# Patient Record
Sex: Female | Born: 1955 | Race: White | Hispanic: No | Marital: Single | Smoking: Never smoker
Health system: Southern US, Community
[De-identification: ages and names within clinical notes are randomized; demographics above are authoritative.]

## PROBLEM LIST (undated history)

## (undated) DIAGNOSIS — E119 Type 2 diabetes mellitus without complications: Secondary | ICD-10-CM

## (undated) DIAGNOSIS — I10 Essential (primary) hypertension: Secondary | ICD-10-CM

## (undated) DIAGNOSIS — M792 Neuralgia and neuritis, unspecified: Secondary | ICD-10-CM

## (undated) HISTORY — PX: ROTATOR CUFF REPAIR: SHX139

## (undated) HISTORY — PX: ABDOMINAL HYSTERECTOMY: SHX81

---

## 2007-11-29 ENCOUNTER — Ambulatory Visit: Payer: Self-pay | Admitting: Family Medicine

## 2012-03-24 ENCOUNTER — Ambulatory Visit: Payer: Self-pay | Admitting: Internal Medicine

## 2012-03-24 LAB — COMPREHENSIVE METABOLIC PANEL
Albumin: 3.9 g/dL (ref 3.4–5.0)
Alkaline Phosphatase: 101 U/L (ref 50–136)
Anion Gap: 10 (ref 7–16)
Bilirubin,Total: 0.3 mg/dL (ref 0.2–1.0)
Calcium, Total: 9.2 mg/dL (ref 8.5–10.1)
Chloride: 100 mmol/L (ref 98–107)
Co2: 25 mmol/L (ref 21–32)
Creatinine: 0.98 mg/dL (ref 0.60–1.30)
EGFR (African American): >60
EGFR (Non-African Amer.): >60
Glucose: 122 mg/dL — ABNORMAL HIGH (ref 65–99)
Osmolality: 277 (ref 275–301)
SGOT(AST): 70 U/L — ABNORMAL HIGH (ref 15–37)
SGPT (ALT): 70 U/L
Total Protein: 8.2 g/dL (ref 6.4–8.2)

## 2012-03-24 LAB — URINALYSIS, COMPLETE
Bacteria: NEGATIVE
Bilirubin,UR: NEGATIVE
Glucose,UR: NEGATIVE mg/dL (ref 0–75)
Ketone: NEGATIVE
Nitrite: NEGATIVE
Ph: 6 (ref 4.5–8.0)

## 2012-03-24 LAB — CBC WITH DIFFERENTIAL/PLATELET
Basophil #: 0 10*3/uL (ref 0.0–0.1)
Basophil %: 0.3 %
Eosinophil #: 0 10*3/uL (ref 0.0–0.7)
Eosinophil %: 0.4 %
HCT: 39.5 % (ref 35.0–47.0)
MCH: 29.2 pg (ref 26.0–34.0)
MCHC: 33 g/dL (ref 32.0–36.0)
MCV: 89 fL (ref 80–100)
Monocyte #: 0.9 x10 3/mm (ref 0.2–0.9)
Monocyte %: 12 %
Neutrophil #: 4.3 10*3/uL (ref 1.4–6.5)
Platelet: 194 10*3/uL (ref 150–440)
RDW: 13.7 % (ref 11.5–14.5)
WBC: 7.8 10*3/uL (ref 3.6–11.0)

## 2012-03-24 LAB — AMYLASE: Amylase: 19 U/L — ABNORMAL LOW (ref 25–115)

## 2012-03-24 LAB — LIPASE, BLOOD: Lipase: 289 U/L (ref 73–393)

## 2012-03-25 LAB — URINE CULTURE

## 2017-09-22 ENCOUNTER — Other Ambulatory Visit: Payer: Self-pay | Admitting: Orthopedic Surgery

## 2017-09-22 DIAGNOSIS — M25562 Pain in left knee: Secondary | ICD-10-CM

## 2017-09-29 ENCOUNTER — Ambulatory Visit
Admission: RE | Admit: 2017-09-29 | Discharge: 2017-09-29 | Disposition: A | Discharge status: Home / Self Care | Payer: BLUE CROSS/BLUE SHIELD | Source: Ambulatory Visit | Attending: Orthopedic Surgery | Admitting: Orthopedic Surgery

## 2017-09-29 DIAGNOSIS — M25562 Pain in left knee: Secondary | ICD-10-CM | POA: Diagnosis present

## 2017-09-29 DIAGNOSIS — M1712 Unilateral primary osteoarthritis, left knee: Secondary | ICD-10-CM | POA: Diagnosis not present

## 2018-10-10 ENCOUNTER — Ambulatory Visit
Admission: EM | Admit: 2018-10-10 | Discharge: 2018-10-10 | Disposition: A | Discharge status: Home / Self Care | Payer: BLUE CROSS/BLUE SHIELD | Attending: Family Medicine | Admitting: Family Medicine

## 2018-10-10 ENCOUNTER — Other Ambulatory Visit: Payer: Self-pay

## 2018-10-10 ENCOUNTER — Encounter: Payer: Self-pay | Admitting: Emergency Medicine

## 2018-10-10 DIAGNOSIS — M79601 Pain in right arm: Secondary | ICD-10-CM

## 2018-10-10 HISTORY — DX: Neuralgia and neuritis, unspecified: M79.2

## 2018-10-10 HISTORY — DX: Essential (primary) hypertension: I10

## 2018-10-10 HISTORY — DX: Type 2 diabetes mellitus without complications: E11.9

## 2018-10-10 MED ORDER — MELOXICAM 15 MG PO TABS: 15.0000 mg | ORAL_TABLET | Freq: Every day | ORAL | 0 refills | Status: DC | PRN

## 2018-10-10 NOTE — Discharge Instructions (Signed)
Use the mobic as prescribed.  No ibuprofen and naproxen while on the mobic.  Take care  Dr. Adriana Simas

## 2018-10-10 NOTE — ED Triage Notes (Signed)
Patient c/o right arm pain and hand pain that started today. She does state that she got her right middle finger got caught inside the dryer door and is unsure if this is related to her pain now.

## 2018-10-10 NOTE — ED Provider Notes (Signed)
[facility]    CSN: [id number] Arrival date & time: 10/10/18  1251  History   Chief Complaint Chief Complaint  Patient presents with  . Hand Pain   HPI  62 year old Robin [name] presents with right arm and hand pain.  Started yesterday.  Patient states that on Sunday she hyperextended her middle finger.  She is unsure if this is contributing.  Patient reports achy pain in the middle of her dorsal forearm which extends in a linear fashion to her middle finger.  Pain has been severe but is improved currently.  She has taken some ibuprofen with improvement.  No wrist pain.  No elbow pain.  No numbness or tingling.  No other associated symptoms.  No other complaints.  Hx reviewed and updated as below. Past Medical History:  Diagnosis Date  . Diabetes mellitus without complication (HCC)   . Hypertension   . Nerve pain    Past Surgical History:  Procedure Laterality Date  . ABDOMINAL HYSTERECTOMY    . ROTATOR CUFF REPAIR     OB History   None    Home Medications    Prior to Admission medications   Medication Sig Start Date End Date Taking? Authorizing Provider  gabapentin (NEURONTIN) 300 MG capsule  09/12/18   [provider]  losartan (COZAAR) 100 MG tablet  09/12/18   [provider]  Magnesium Oxide, Antacid, 500 MG CAPS Take by mouth.    [provider]  meloxicam (MOBIC) 15 MG tablet Take 1 tablet (15 mg total) by mouth daily as needed. 10/10/18   Tommie Sams, DO  metFORMIN (GLUCOPHAGE) 500 MG tablet  08/03/18   [provider]  Multiple Vitamin (MULTI-VITAMINS) TABS Take by mouth.    [provider]  VICTOZA 18 MG/3ML SOPN  09/15/18   [provider]   Social History Social History   Tobacco Use  . Smoking status: Never Smoker  . Smokeless tobacco: Current User  Substance Use Topics  . Alcohol use: Never    Frequency: Never  . Drug use: Never     Allergies   Patient has no known  allergies.   Review of Systems Review of Systems  Musculoskeletal:       Arm/hand pain (right).  Neurological: Negative for numbness.   Physical Exam Triage Vital Signs ED Triage Vitals  Enc Vitals Group     BP 10/10/18 1304 118/Robin     Pulse Rate 10/10/18 1304 76     Resp 10/10/18 1304 18     Temp 10/10/18 1304 98 F (36.7 C)     Temp Source 10/10/18 1304 Oral     SpO2 10/10/18 1304 99 %     Weight 10/10/18 1300 190 lb (86.2 kg)     Height 10/10/18 1300 5\' 3"  (1.6 m)     Head Circumference --      Peak Flow --      Pain Score 10/10/18 1300 4     Pain Loc --      Pain Edu? --      Excl. in GC? --    Updated Vital Signs BP 118/Robin (BP Location: Left Arm)   Pulse 76   Temp 98 F (36.7 C) (Oral)   Resp 18   Ht 5\' 3"  (1.6 m)   Wt 86.2 kg   SpO2 99%   BMI 33.66 kg/m   Visual Acuity Right Eye Distance:   Left Eye Distance:   Bilateral Distance:    Right  Eye Near:   Left Eye Near:    Bilateral Near:     Physical Exam  Constitutional: She is oriented to person, place, and time. She appears well-developed. No distress.  HENT:  Head: Normocephalic.  Right Ear: External ear normal.  Pulmonary/Chest: Effort normal. No respiratory distress.  Musculoskeletal:  Elbow, forearm, and hand without discrete areas of tenderness.  Normal range of motion.  Neurological: She is alert and oriented to person, place, and time.  Psychiatric: She has a normal mood and affect. Her behavior is normal.  Nursing note and vitals reviewed.  UC Treatments / Results  Labs (all labs ordered are listed, but only abnormal results are displayed) Labs Reviewed - No data to display  EKG None  Radiology No results found.  Procedures Procedures (including critical care time)  Medications Ordered in UC Medications - No data to display  Initial Impression / Assessment and Plan / UC Course  I have reviewed the triage vital signs and the nursing notes.  Pertinent labs & imaging results  that were available during my care of the patient were reviewed by me and considered in my medical decision making (see chart for details).    62 year old Robin [name] presents with right forearm pain.  Etiology unclear.  Could be related to hyperextension of the middle finger.  No indication for imaging.  Already improved.  Meloxicam as needed.  Supportive care.  Final Clinical Impressions(s) / UC Diagnoses   Final diagnoses:  Right arm pain     Discharge Instructions     Use the mobic as prescribed.  No ibuprofen and naproxen while on the mobic.  Take care  Dr. Adriana Simas    ED Prescriptions    Medication Sig Dispense Auth. Provider   meloxicam (MOBIC) 15 MG tablet Take 1 tablet (15 mg total) by mouth daily as needed. 30 tablet Tommie Sams, DO     Controlled Substance Prescriptions  Controlled Substance Registry consulted? Not Applicable   Tommie Sams, DO 10/10/18 1344

## 2018-11-06 ENCOUNTER — Other Ambulatory Visit: Payer: Self-pay | Admitting: Family Medicine

## 2020-07-21 ENCOUNTER — Ambulatory Visit (INDEPENDENT_AMBULATORY_CARE_PROVIDER_SITE_OTHER): Payer: Medicare (Managed Care)

## 2020-07-21 ENCOUNTER — Ambulatory Visit
Admission: EM | Admit: 2020-07-21 | Discharge: 2020-07-21 | Disposition: A | Discharge status: Home / Self Care | Payer: Medicare (Managed Care)

## 2020-07-21 ENCOUNTER — Other Ambulatory Visit: Payer: Self-pay

## 2020-07-21 ENCOUNTER — Encounter: Payer: Self-pay | Admitting: Emergency Medicine

## 2020-07-21 DIAGNOSIS — S0083XA Contusion of other part of head, initial encounter: Secondary | ICD-10-CM

## 2020-07-21 DIAGNOSIS — W19XXXA Unspecified fall, initial encounter: Secondary | ICD-10-CM

## 2020-07-21 DIAGNOSIS — S2241XA Multiple fractures of ribs, right side, initial encounter for closed fracture: Secondary | ICD-10-CM

## 2020-07-21 MED ORDER — NAPROXEN 500 MG PO TABS: 500.0000 mg | ORAL_TABLET | Freq: Two times a day (BID) | ORAL | 0 refills | Status: AC

## 2020-07-21 NOTE — ED Triage Notes (Addendum)
Pt fell about 3 days ago. She hit her right cheek on concert. She has swelling and bruising in the area. She also c/o right sided rib pain. She later states she was nauseous and dizzy after the fall. She has had a headache since the fall.

## 2020-07-21 NOTE — ED Provider Notes (Signed)
[facility]    CSN: [id number] Arrival date & time: 07/21/20  1109      History   Chief Complaint Chief Complaint  Patient presents with  . Fall    HPI Robin [name] is a 64 y.o. female.   Patient is a 64 year old female who presents with chief complaint of pain after a fall on Friday.  Patient states she was walking out to the mailbox when she fell on a sidewalk, falling and hitting her chest and then her face on the concrete.  She states it felt like hitting a hammer.  She states she was dazed a bit afterwards but got herself up to a sitting position and was able to get up and get into her car that was parked nearby.  She sat in her car and states she started crying which is outside her normal and does not know why.  She also started having some shaking and waves of nausea.  She states after nausea improved she called her daughter help her get back in the house.  That night she had pain on the left side of her chest behind her breast.  She states this is worse with movement and picking up items, coughing, sneezing and taking a deep breath while lying flat.  She also reports the pain is worse after she takes her bra off.  Patient will also reports pain and swelling to the right side of her face.  Feels like she has a little pressure in that area in the cheekbone is tender to the touch.  Patient denies any change to her vision, but again feels like there is a little pressure like when she is rubbing her eyes.  She is been taking ibuprofen and Tylenol for pain.  Patient is not on any blood thinners.  Patient denies any shortness of breath or abdominal symptoms.  She reports she did land on her knees as well but her knees are feeling better.     Past Medical History:  Diagnosis Date  . Diabetes mellitus without complication (HCC)   . Hypertension   . Nerve pain     There are no problems to display for this patient.   Past Surgical History:  Procedure Laterality  Date  . ABDOMINAL HYSTERECTOMY    . ROTATOR CUFF REPAIR      OB History   No obstetric history on file.      Home Medications    Prior to Admission medications   Medication Sig Start Date End Date Taking? Authorizing Provider  gabapentin (NEURONTIN) 300 MG capsule  09/12/18  Yes [provider]  losartan (COZAAR) 100 MG tablet  09/12/18  Yes [provider]  Magnesium Oxide, Antacid, 500 MG CAPS Take by mouth.   Yes [provider]  metFORMIN (GLUCOPHAGE) 500 MG tablet  08/03/18  Yes [provider]  Multiple Vitamin (MULTI-VITAMINS) TABS Take by mouth.   Yes [provider]  naproxen (NAPROSYN) 500 MG tablet Take 1 tablet (500 mg total) by mouth 2 (two) times daily. 07/21/20   Candis Schatz, PA-C  OZEMPIC, 1 MG/DOSE, 2 MG/1.5ML SOPN Inject 1 mg into the skin once a week. 06/02/20   [provider]  VICTOZA 18 MG/3ML SOPN  09/15/18   [provider]    Family History History reviewed. No pertinent family history.  Social History Social History   Tobacco Use  . Smoking status: Never Smoker  . Smokeless tobacco: Current User  Vaping Use  .  Vaping Use: Never used  Substance Use Topics  . Alcohol use: Never  . Drug use: Never     Allergies   Augmentin [amoxicillin-pot clavulanate]   Review of Systems Review of Systems as noted above in HPI.  Other systems reviewed and found to be negative   Physical Exam Triage Vital Signs ED Triage Vitals  Enc Vitals Group     BP 07/21/20 1147 (!) 165/85     Pulse Rate 07/21/20 1147 69     Resp 07/21/20 1147 18     Temp 07/21/20 1147 98.5 F (36.9 C)     Temp Source 07/21/20 1147 Oral     SpO2 07/21/20 1147 100 %     Weight 07/21/20 1142 190 lb 0.6 oz (86.2 kg)     Height 07/21/20 1142 5\' 3"  (1.6 m)     Head Circumference --      Peak Flow --      Pain Score 07/21/20 1142 2     Pain Loc --      Pain Edu? --      Excl. in GC? --    No data found.  Updated  Vital Signs BP (!) 165/85 (BP Location: Left Arm)   Pulse 69   Temp 98.5 F (36.9 C) (Oral)   Resp 18   Ht 5\' 3"  (1.6 m)   Wt 190 lb 0.6 oz (86.2 kg)   SpO2 100%   BMI 33.66 kg/m    Physical Exam Constitutional:      Appearance: Normal appearance. She is not ill-appearing.  HENT:     Head:      Right Ear: Tympanic membrane and ear canal normal.     Left Ear: Tympanic membrane normal.     Nose: Nose normal.  Eyes:     Extraocular Movements: Extraocular movements intact.     Comments: Bruising below right eye, see image pupils equal round reactive to light and accommodation  Cardiovascular:     Rate and Rhythm: Normal rate and regular rhythm.  Pulmonary:     Effort: Pulmonary effort is normal. No respiratory distress.     Breath sounds: Normal breath sounds. No wheezing or rales.  Chest:     Chest wall: Tenderness (on right side above breast to palpation) present.  Musculoskeletal:        General: Normal range of motion.  Skin:    General: Skin is warm and dry.  Neurological:     General: No focal deficit present.     Mental Status: She is alert and oriented to person, place, and time.     Cranial Nerves: No cranial nerve deficit.        UC Treatments / Results  Labs (all labs ordered are listed, but only abnormal results are displayed) Labs Reviewed - No data to display  EKG   Radiology DG Ribs Unilateral W/Chest Right  Result Date: 07/21/2020 CLINICAL DATA:  Right anterior rib pain since fall 4 days ago. EXAM: RIGHT RIBS AND CHEST - 3+ VIEW COMPARISON:  None. FINDINGS: Acute nondisplaced fracture of the right anterior fourth rib. There is no evidence of pneumothorax or pleural effusion. Both lungs are clear. Heart size and mediastinal contours are within normal limits. IMPRESSION: 1. Acute nondisplaced fracture of the right anterior fourth rib. 2.  No active cardiopulmonary disease. Electronically Signed   By: Obie DredgeWilliam T Derry M.D.   On: 07/21/2020 13:48   CT  Head Wo Contrast  Result Date: 07/21/2020 CLINICAL DATA:  Fall 3 days ago with facial trauma. Headache, nausea, and dizziness. EXAM: CT HEAD WITHOUT CONTRAST CT MAXILLOFACIAL WITHOUT CONTRAST TECHNIQUE: Multidetector CT imaging of the head and maxillofacial structures were performed using the standard protocol without intravenous contrast. Multiplanar CT image reconstructions of the maxillofacial structures were also generated. COMPARISON:  None. FINDINGS: CT HEAD FINDINGS Brain: There is no evidence of an acute infarct, intracranial hemorrhage, mass, midline shift, or extra-axial fluid collection. The ventricles and sulci are normal. Vascular: Calcified atherosclerosis at the skull base. No hyperdense vessel. Skull: No fracture or suspicious osseous lesion. Other: None. CT MAXILLOFACIAL FINDINGS Osseous: No acute fracture, destructive osseous process, or mandibular dislocation. Orbits: Unremarkable. Sinuses: Paranasal sinuses and mastoid air cells are clear. Soft tissues: Mild right-sided facial soft tissue swelling. Small left parotid calcifications. IMPRESSION: 1. No evidence of acute intracranial abnormality. 2. No acute maxillofacial fracture. 3. Mild right-sided facial soft tissue swelling. Electronically Signed   By: Sebastian Ache M.D.   On: 07/21/2020 14:47   CT MAXILLOFACIAL WO CONTRAST  Result Date: 07/21/2020 CLINICAL DATA:  Fall 3 days ago with facial trauma. Headache, nausea, and dizziness. EXAM: CT HEAD WITHOUT CONTRAST CT MAXILLOFACIAL WITHOUT CONTRAST TECHNIQUE: Multidetector CT imaging of the head and maxillofacial structures were performed using the standard protocol without intravenous contrast. Multiplanar CT image reconstructions of the maxillofacial structures were also generated. COMPARISON:  None. FINDINGS: CT HEAD FINDINGS Brain: There is no evidence of an acute infarct, intracranial hemorrhage, mass, midline shift, or extra-axial fluid collection. The ventricles and sulci are normal.  Vascular: Calcified atherosclerosis at the skull base. No hyperdense vessel. Skull: No fracture or suspicious osseous lesion. Other: None. CT MAXILLOFACIAL FINDINGS Osseous: No acute fracture, destructive osseous process, or mandibular dislocation. Orbits: Unremarkable. Sinuses: Paranasal sinuses and mastoid air cells are clear. Soft tissues: Mild right-sided facial soft tissue swelling. Small left parotid calcifications. IMPRESSION: 1. No evidence of acute intracranial abnormality. 2. No acute maxillofacial fracture. 3. Mild right-sided facial soft tissue swelling. Electronically Signed   By: Sebastian Ache M.D.   On: 07/21/2020 14:47    Procedures Procedures (including critical care time)  Medications Ordered in UC Medications - No data to display  Initial Impression / Assessment and Plan / UC Course  I have reviewed the triage vital signs and the nursing notes.  Pertinent labs & imaging results that were available during my care of the patient were reviewed by me and considered in my medical decision making (see chart for details).    Patient status post fall onto a concrete sidewalk this past Friday landing on her chest and then her face.  Patient reports chest wall pain with movement, sneezing, cough, and worse when she has taken her bra off.  This is concerning for chest wall pain and possible rib fracture.  We will get an x-ray to evaluate.  Patient with swelling to the right side of the face, mostly around the eye and the zygomatic bone.  Tenderness to this area to palpation.  Patient also reporting headache since she fell on Friday.  We will get a CT to evaluate for any possible facial fractures or head bleed that could be causing her headache.  X-ray as above showing a rib fracture on the right.  CT imaging without any acute fractures.  Final Clinical Impressions(s) / UC Diagnoses   Final diagnoses:  Fall, initial encounter  Contusion of face, initial encounter  Closed fracture of  five ribs of right side, initial encounter  Discharge Instructions     -Naproxen: 1 tablet every 12 hours for pain inflammation -Avoid taking ibuprofen while taking this medication -Can also use Tylenol for pain. -Ice therapy as attached -Follow-up with primary care provider should symptoms worsen or not improve.    ED Prescriptions    Medication Sig Dispense Auth. Provider   naproxen (NAPROSYN) 500 MG tablet Take 1 tablet (500 mg total) by mouth 2 (two) times daily. 30 tablet Candis Schatz, PA-C     PDMP not reviewed this encounter.   Candis Schatz, PA-C 07/21/20 1454

## 2020-07-21 NOTE — ED Notes (Signed)
CT approved through 10/19/2020 Auth number [id number]

## 2020-07-21 NOTE — Discharge Instructions (Addendum)
-  Naproxen: 1 tablet every 12 hours for pain inflammation -Avoid taking ibuprofen while taking this medication -Can also use Tylenol for pain. -Ice therapy as attached -Follow-up with primary care provider should symptoms worsen or not improve.

## 2020-08-22 ENCOUNTER — Ambulatory Visit: Payer: Self-pay

## 2021-05-24 IMAGING — CT CT MAXILLOFACIAL W/O CM
1 of 2 series · 15 of 30 positions shown, 19 images · non-contrast
Comparison: None.

CLINICAL DATA: Fall 3 days ago with facial trauma. Headache,
nausea, and dizziness.

EXAM:
CT HEAD WITHOUT CONTRAST
CT MAXILLOFACIAL WITHOUT CONTRAST
TECHNIQUE: Multidetector CT imaging of the head and maxillofacial structures
were performed using the standard protocol without intravenous
contrast. Multiplanar CT image reconstructions of the maxillofacial
structures were also generated.

[Series 4: st thins (person_name) · axial · 0.32mm/px · z∈[-192,-66]mm · 15 of 275 slices shown, 19 images]
[im 12/275  brain]
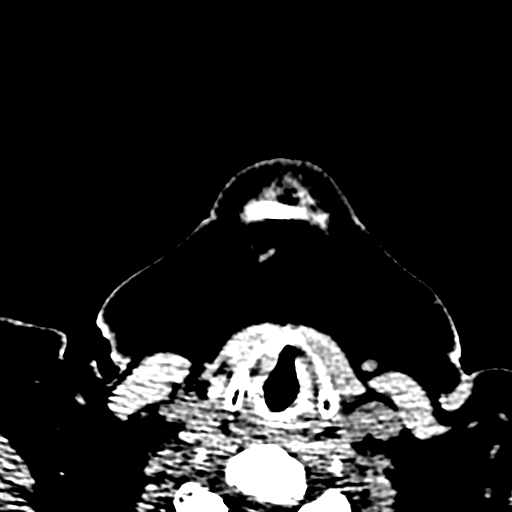
[im 12/275  bone]
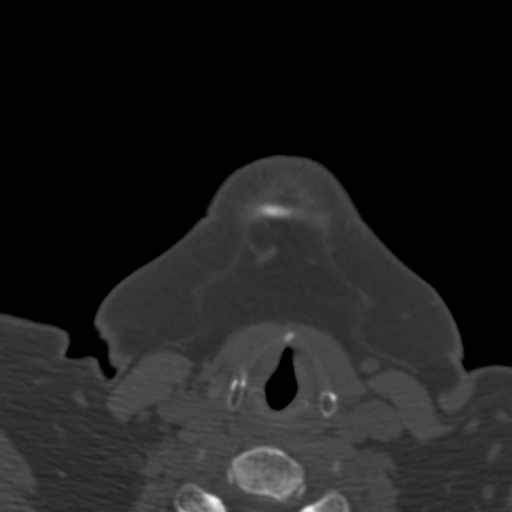
[im 36/275  bone]
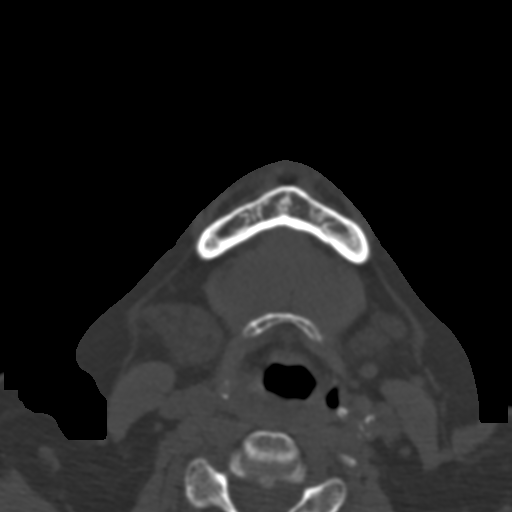
[im 48/275  bone]
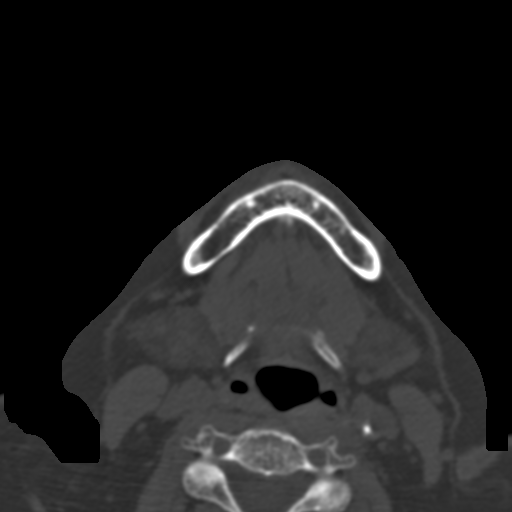
[im 72/275  bone]
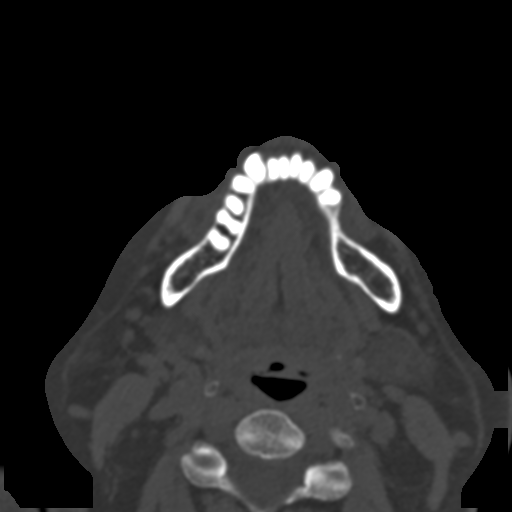
[im 84/275  brain]
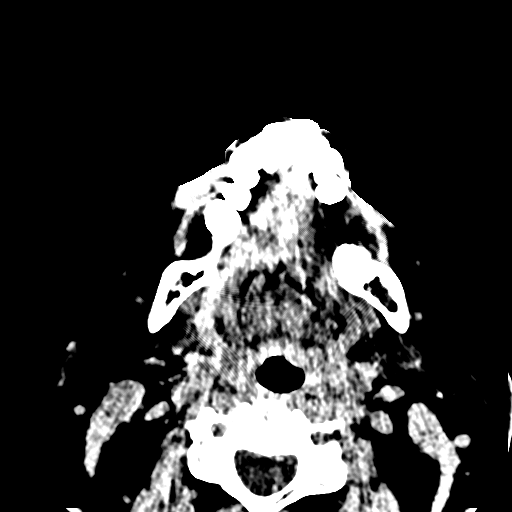
[im 84/275  bone]
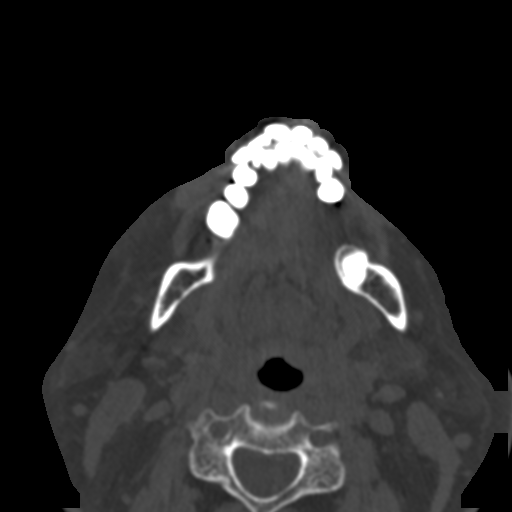
[im 108/275  bone]
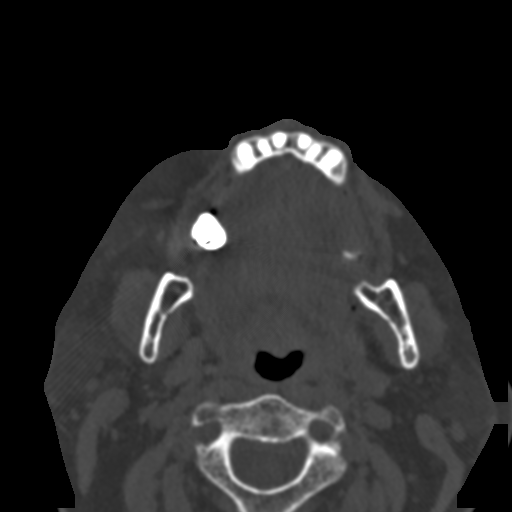
[im 120/275  bone]
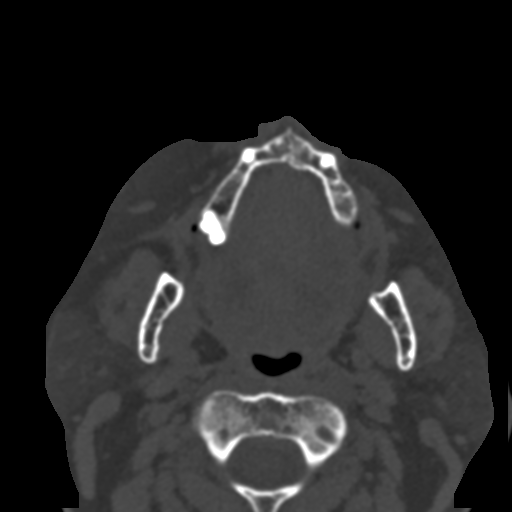
[im 143/275  bone]
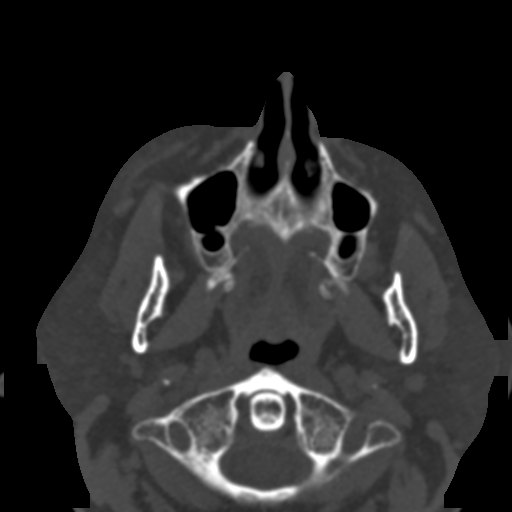
[im 155/275  brain]
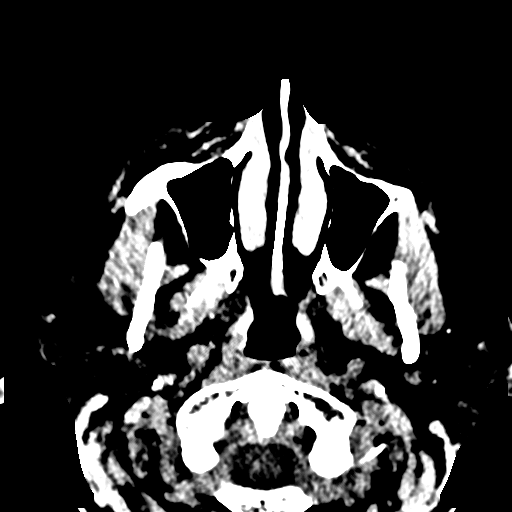
[im 155/275  bone]
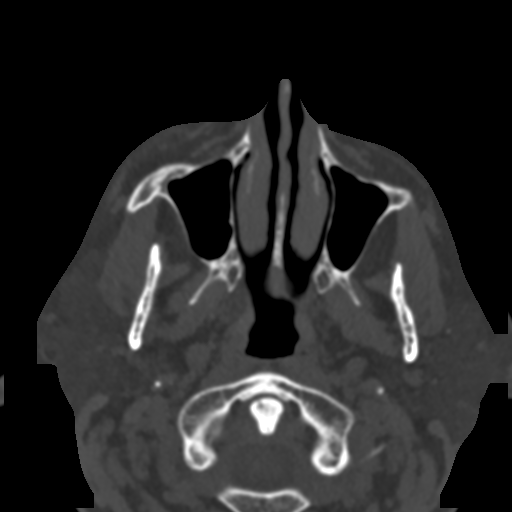
[im 167/275  bone]
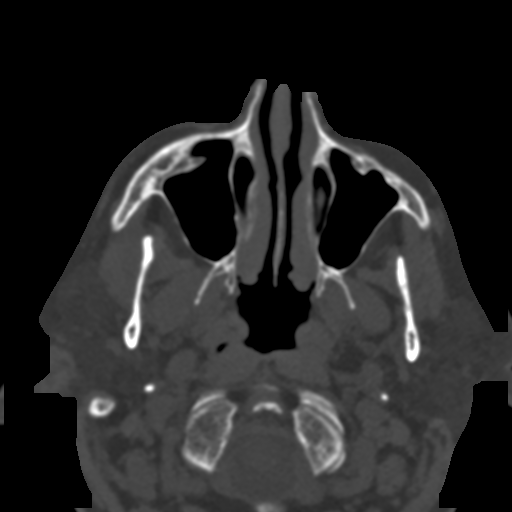
[im 191/275  bone]
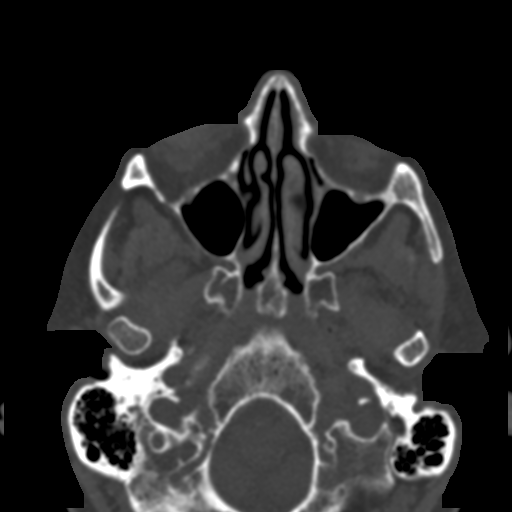
[im 203/275  bone]
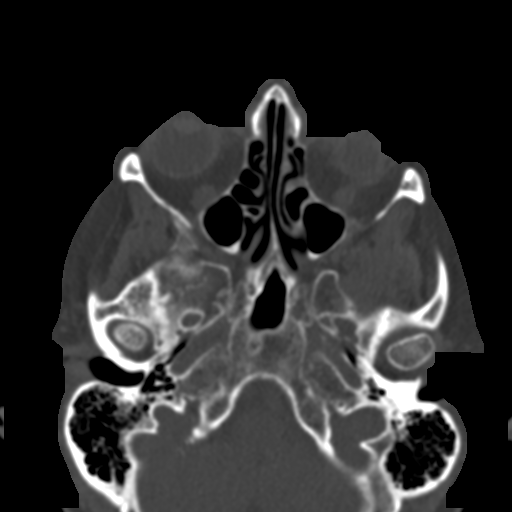
[im 227/275  brain]
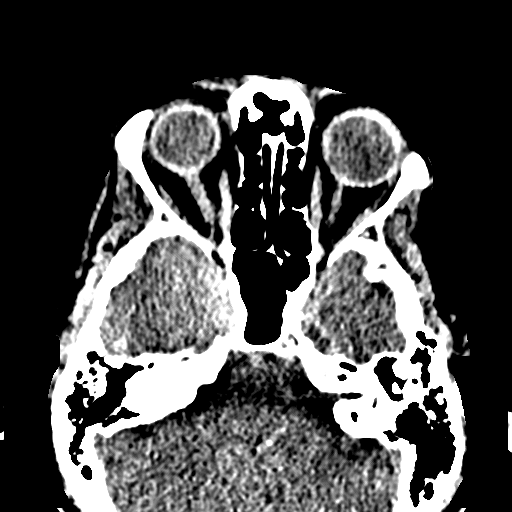
[im 227/275  bone]
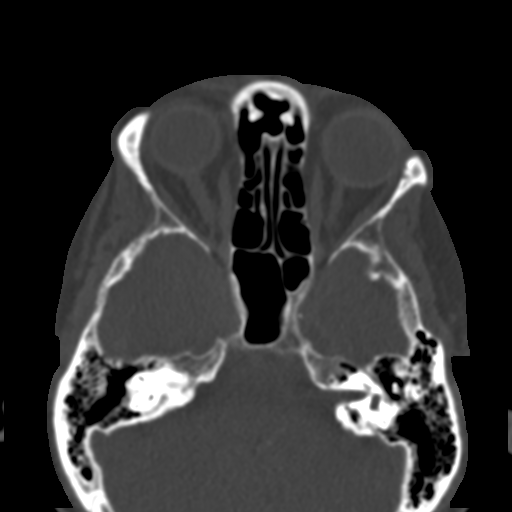
[im 239/275  bone]
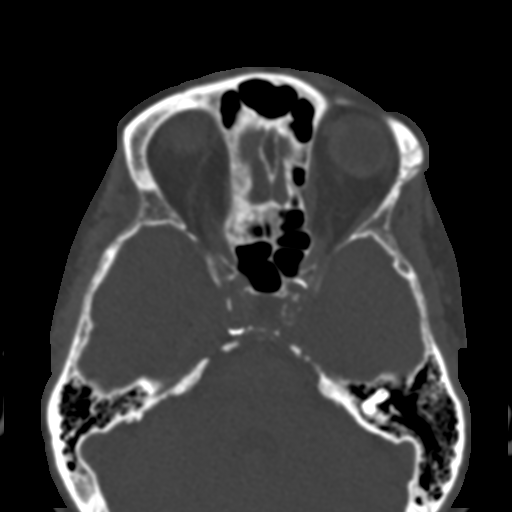
[im 263/275  bone]
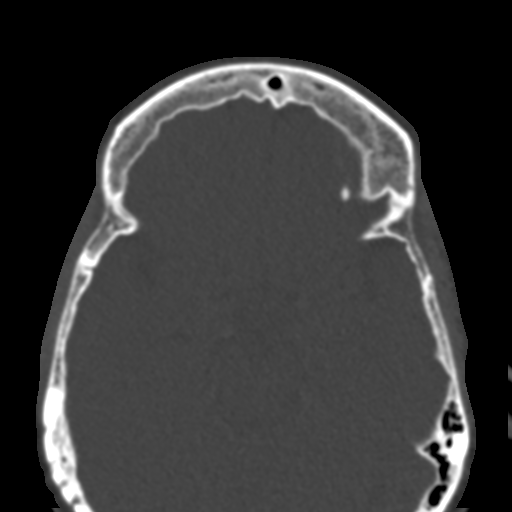

[15 of 30 positions shown; findings below may reference images not displayed]

FINDINGS: CT HEAD FINDINGS

Brain: There is no evidence of an acute infarct, intracranial
hemorrhage, mass, midline shift, or extra-axial fluid collection.
The ventricles and sulci are normal.

Vascular: Calcified atherosclerosis at the skull base. No hyperdense
vessel.

Skull: No fracture or suspicious osseous lesion.

Other: None.

CT MAXILLOFACIAL FINDINGS

Osseous: No acute fracture, destructive osseous process, or
mandibular dislocation.

Orbits: Unremarkable.

Sinuses: Paranasal sinuses and mastoid air cells are clear.

Soft tissues: Mild right-sided facial soft tissue swelling. Small
left parotid calcifications.
IMPRESSION: 1. No evidence of acute intracranial abnormality.
2. No acute maxillofacial fracture.
3. Mild right-sided facial soft tissue swelling.
